# Patient Record
Sex: Male | Born: 1990 | Race: Black or African American | Hispanic: No | Marital: Single | Smoking: Never smoker
Health system: Southern US, Community
[De-identification: ages and names within clinical notes are randomized; demographics above are authoritative.]

## PROBLEM LIST (undated history)

## (undated) DIAGNOSIS — T4145XA Adverse effect of unspecified anesthetic, initial encounter: Secondary | ICD-10-CM

## (undated) DIAGNOSIS — F909 Attention-deficit hyperactivity disorder, unspecified type: Secondary | ICD-10-CM

## (undated) DIAGNOSIS — K219 Gastro-esophageal reflux disease without esophagitis: Secondary | ICD-10-CM

## (undated) DIAGNOSIS — T8859XA Other complications of anesthesia, initial encounter: Secondary | ICD-10-CM

## (undated) DIAGNOSIS — F319 Bipolar disorder, unspecified: Secondary | ICD-10-CM

## (undated) HISTORY — PX: OTHER SURGICAL HISTORY: SHX169

---
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
## 2012-04-13 HISTORY — PX: COLONOSCOPY: SHX5424

## 2012-05-19 ENCOUNTER — Encounter (HOSPITAL_COMMUNITY): Payer: Self-pay | Admitting: *Deleted

## 2012-05-19 ENCOUNTER — Emergency Department (HOSPITAL_COMMUNITY)
Admission: EM | Admit: 2012-05-19 | Discharge: 2012-05-19 | Disposition: A | Discharge status: Home / Self Care | Payer: Medicaid Other | Attending: Emergency Medicine | Admitting: Emergency Medicine

## 2012-05-19 DIAGNOSIS — R229 Localized swelling, mass and lump, unspecified: Secondary | ICD-10-CM | POA: Insufficient documentation

## 2012-05-19 DIAGNOSIS — Z8659 Personal history of other mental and behavioral disorders: Secondary | ICD-10-CM | POA: Insufficient documentation

## 2012-05-19 DIAGNOSIS — L03019 Cellulitis of unspecified finger: Secondary | ICD-10-CM | POA: Insufficient documentation

## 2012-05-19 DIAGNOSIS — L03012 Cellulitis of left finger: Secondary | ICD-10-CM

## 2012-05-19 HISTORY — DX: Bipolar disorder, unspecified: F31.9

## 2012-05-19 HISTORY — DX: Attention-deficit hyperactivity disorder, unspecified type: F90.9

## 2012-05-19 MED ORDER — DOXYCYCLINE HYCLATE 100 MG PO CAPS: 100.0000 mg | ORAL_CAPSULE | Freq: Two times a day (BID) | ORAL | Status: DC

## 2012-05-19 MED ORDER — IBUPROFEN 600 MG PO TABS: 600.0000 mg | ORAL_TABLET | Freq: Four times a day (QID) | ORAL | Status: DC | PRN

## 2012-05-19 MED ORDER — IBUPROFEN 800 MG PO TABS
800.0000 mg | ORAL_TABLET | Freq: Once | ORAL | Status: AC
  Administered 2012-05-19: 800 mg via ORAL
  Filled 2012-05-19: qty 1

## 2012-05-19 MED ORDER — SULFAMETHOXAZOLE-TMP DS 800-160 MG PO TABS
1.0000 | ORAL_TABLET | Freq: Once | ORAL | Status: AC
  Administered 2012-05-19: 1 via ORAL
  Filled 2012-05-19: qty 1

## 2012-05-19 NOTE — ED Notes (Signed)
Pain lt 5th finger , onset today, says pus came out around nail.

## 2012-05-19 NOTE — ED Provider Notes (Signed)
History     CSN: [id number]  Arrival date & time 05/19/12  1756   First MD Initiated Contact with Patient 05/19/12 1827      Chief Complaint  Patient presents with  . Hand Pain    (Consider location/radiation/quality/duration/timing/severity/associated sxs/prior treatment) HPI Comments: Jermaine Williams is a 22 y.o. Male presenting with pain and swelling to the nail edge of his left 5th finger which started today.  He describes squeezing the area and a small amount of pus came out and the swelling is now reduced.  He continues to have pain in the finger tip.  He has taken no medications prior to arrival.  He denies fevers, chills and radiation of pain,  But has noticed a sore nodule at his left elbow today.  He has no other pertinent medical history.     The history is provided by the patient and a relative.    Past Medical History  Diagnosis Date  . Bipolar 1 disorder   . ADHD (attention deficit hyperactivity disorder)     History reviewed. No pertinent past surgical history.  History reviewed. No pertinent family history.  History  Substance Use Topics  . Smoking status: Never Smoker   . Smokeless tobacco: Not on file  . Alcohol Use: No      Review of Systems  Constitutional: Negative for fever and chills.  HENT: Negative for facial swelling.   Respiratory: Negative for shortness of breath and wheezing.   Skin: Positive for wound.  Neurological: Negative for numbness.    Allergies  Review of patient's allergies indicates no known allergies.  Home Medications   Current Outpatient Rx  Name  Route  Sig  Dispense  Refill  . DOXYCYCLINE HYCLATE 100 MG PO CAPS   Oral   Take 1 capsule (100 mg total) by mouth 2 (two) times daily.   20 capsule   0   . IBUPROFEN 600 MG PO TABS   Oral   Take 1 tablet (600 mg total) by mouth every 6 (six) hours as needed for pain.   30 tablet   0     BP 149/97  Pulse 76  Temp 97.9 F (36.6 C) (Oral)  Resp 18  Ht 5\' 11"   (1.803 m)  Wt 147 lb (66.679 kg)  BMI 20.50 kg/m2  SpO2 100%  Physical Exam  Constitutional: He is oriented to person, place, and time. He appears well-developed and well-nourished.  HENT:  Head: Normocephalic.  Cardiovascular: Normal rate.   Pulmonary/Chest: Effort normal.  Musculoskeletal: He exhibits tenderness.       Hands: Lymphadenopathy:       Left: Epitrochlear adenopathy present.  Neurological: He is alert and oriented to person, place, and time. No sensory deficit.  Skin:       Mild erythema and edema at lateral fifth left fifth finger at cuticle edge.  There is no fluctuance and no drainage, no induration at the site.  There is no red streaking and no edema of the finger tuft.      ED Course  Procedures (including critical care time)  Labs Reviewed - No data to display No results found.   1. Paronychia of fifth finger of left hand       MDM  Patient placed on doxycycline and ibuprofen, first dose prior to discharge home.  He was encouraged to use warm Epsom salt soaks several times daily and to keep area clean and dry at other times.  There is no indication  based on today's exam for I&D of the site, there is no fluctuance, induration or evidence of further retained pus pocket.  When necessary followup anticipate, referrals given for obtaining primary care.        Burgess Amor, PA 05/19/12 1936  Burgess Amor, PA 05/19/12 [phone]

## 2012-05-20 NOTE — ED Provider Notes (Signed)
Medical screening examination/treatment/procedure(s) were performed by non-physician practitioner and as supervising physician I was immediately available for consultation/collaboration.   Golden Emile W. Jaysin Gayler, MD 05/20/12 1313 

## 2012-06-20 ENCOUNTER — Emergency Department (HOSPITAL_COMMUNITY)
Admission: EM | Admit: 2012-06-20 | Discharge: 2012-06-20 | Disposition: A | Discharge status: Home / Self Care | Payer: Medicaid Other | Attending: Emergency Medicine | Admitting: Emergency Medicine

## 2012-06-20 ENCOUNTER — Encounter (HOSPITAL_COMMUNITY): Payer: Self-pay | Admitting: *Deleted

## 2012-06-20 ENCOUNTER — Emergency Department (HOSPITAL_COMMUNITY): Payer: Medicaid Other

## 2012-06-20 DIAGNOSIS — R011 Cardiac murmur, unspecified: Secondary | ICD-10-CM | POA: Insufficient documentation

## 2012-06-20 DIAGNOSIS — F909 Attention-deficit hyperactivity disorder, unspecified type: Secondary | ICD-10-CM | POA: Insufficient documentation

## 2012-06-20 DIAGNOSIS — K219 Gastro-esophageal reflux disease without esophagitis: Secondary | ICD-10-CM | POA: Insufficient documentation

## 2012-06-20 DIAGNOSIS — F319 Bipolar disorder, unspecified: Secondary | ICD-10-CM | POA: Insufficient documentation

## 2012-06-20 DIAGNOSIS — Z79899 Other long term (current) drug therapy: Secondary | ICD-10-CM | POA: Insufficient documentation

## 2012-06-20 MED ORDER — FAMOTIDINE 20 MG PO TABS: 20.0000 mg | ORAL_TABLET | Freq: Two times a day (BID) | ORAL | Status: DC | PRN

## 2012-06-20 MED ORDER — GI COCKTAIL ~~LOC~~
30.0000 mL | Freq: Once | ORAL | Status: AC
  Administered 2012-06-20: 30 mL via ORAL
  Filled 2012-06-20: qty 30

## 2012-06-20 NOTE — ED Provider Notes (Signed)
History     CSN: [id number]  Arrival date & time 06/20/12  1500   First MD Initiated Contact with Patient 06/20/12 1624      Chief Complaint  Patient presents with  . Chest Pain    (Consider location/radiation/quality/duration/timing/severity/associated sxs/prior treatment) Patient is a 22 y.o. male presenting with chest pain. The history is provided by the patient.  Chest Pain Pain location:  Substernal area Pain quality: burning   Pain radiates to:  Does not radiate Pain radiates to the back: no   Pain severity:  Moderate Onset quality:  Sudden Timing:  Constant Progression:  Unchanged Chronicity:  New Context: at rest   Relieved by:  None tried Worsened by:  Nothing tried Associated symptoms: no abdominal pain, no back pain, no cough, no diaphoresis, no dizziness, no fever, no headache, no nausea, no numbness, no palpitations, no shortness of breath, not vomiting and no weakness     Past Medical History  Diagnosis Date  . Bipolar 1 disorder   . ADHD (attention deficit hyperactivity disorder)     History reviewed. No pertinent past surgical history.  History reviewed. No pertinent family history.  History  Substance Use Topics  . Smoking status: Never Smoker   . Smokeless tobacco: Not on file  . Alcohol Use: No      Review of Systems  Constitutional: Negative for fever and diaphoresis.  HENT: Negative for congestion, sore throat and neck pain.   Eyes: Negative.   Respiratory: Negative for cough, chest tightness and shortness of breath.   Cardiovascular: Positive for chest pain. Negative for palpitations.  Gastrointestinal: Negative for nausea, vomiting and abdominal pain.  Genitourinary: Negative.   Musculoskeletal: Negative for back pain, joint swelling and arthralgias.  Skin: Negative.  Negative for rash and wound.  Neurological: Negative for dizziness, weakness, light-headedness, numbness and headaches.  Psychiatric/Behavioral: Negative.      Allergies  Review of patient's allergies indicates no known allergies.  Home Medications   Current Outpatient Rx  Name  Route  Sig  Dispense  Refill  . amphetamine-dextroamphetamine (ADDERALL XR) 30 MG 24 hr capsule   Oral   Take 30 mg by mouth every morning.         Marland Kitchen guanFACINE (TENEX) 1 MG tablet   Oral   Take 1 mg by mouth at bedtime.         Marland Kitchen loratadine (CLARITIN) 10 MG tablet   Oral   Take 10 mg by mouth at bedtime.         Marland Kitchen QUEtiapine (SEROQUEL) 100 MG tablet   Oral   Take 100 mg by mouth at bedtime.         . famotidine (PEPCID) 20 MG tablet   Oral   Take 1 tablet (20 mg total) by mouth 2 (two) times daily as needed for heartburn.   30 tablet   0     BP 145/77  Pulse 84  Temp(Src) 97.4 F (36.3 C) (Oral)  Resp 18  Ht 5\' 11"  (1.803 m)  Wt 148 lb 5 oz (67.274 kg)  BMI 20.69 kg/m2  SpO2 100%  Physical Exam  Nursing note and vitals reviewed. Constitutional: He appears well-developed and well-nourished.  HENT:  Head: Normocephalic and atraumatic.  Eyes: Conjunctivae are normal.  Neck: Normal range of motion.  Cardiovascular: Normal rate, regular rhythm and intact distal pulses.   Murmur heard.  Systolic murmur is present with a grade of 2/6  Murmer appreciated at PMI  ,  [facility]  best with patient in forward flexion.  Pulmonary/Chest: Effort normal and breath sounds normal. He has no wheezes.  Abdominal: Soft. Bowel sounds are normal. There is no tenderness.  Musculoskeletal: Normal range of motion.  Neurological: He is alert.  Skin: Skin is warm and dry.  Psychiatric: He has a normal mood and affect.    ED Course  Procedures (including critical care time)   Date: 06/20/2012  Rate: 72  Rhythm: sinus arrhythmia  QRS Axis: normal  Intervals: normal  ST/T Wave abnormalities: normal  Conduction Disutrbances:none  Narrative Interpretation:   Old EKG Reviewed: none available    Labs Reviewed - No data to display Dg Chest 2  View  06/20/2012  *RADIOLOGY REPORT*  Clinical Data: Chest pain  CHEST - 2 VIEW  Comparison: None.  Findings: Cardiac and mediastinal contours appear normal.  The lungs appear clear.  No pleural effusion is identified.  IMPRESSION:  No significant abnormality identified.   Original Report Authenticated By: Gaylyn Rong, M.D.      1. Acid reflux     Pt given gi cocktail with complete resolution of sx.    MDM  Patients labs and/or radiological studies were reviewed during the medical decision making and disposition process. ekg normal.  cxr normal with no evidence of sequalae from heart murmer. Pt states he was told when he was a child he had a heart murmer,  When living in [place] with birth mother.  Denies palpitations,  Sob.  Pt was treated for acid reflux today with pepcid.  He has aged out from seeing Dr Brunilda Payor,  Is planning to establish with Dr  Olena Leatherwood next week.  Encouraged to discuss heart murmer with new pcp.         Burgess Amor, PA-C 06/20/12 1812  Burgess Amor, PA-C 06/20/12 2002

## 2012-06-21 NOTE — ED Provider Notes (Signed)
Medical screening examination/treatment/procedure(s) were performed by non-physician practitioner and as supervising physician I was immediately available for consultation/collaboration.   Charles B. Sheldon, MD 06/21/12 0024 

## 2012-09-06 ENCOUNTER — Encounter (HOSPITAL_COMMUNITY): Payer: Self-pay

## 2012-09-06 ENCOUNTER — Emergency Department (HOSPITAL_COMMUNITY)
Admission: EM | Admit: 2012-09-06 | Discharge: 2012-09-06 | Disposition: A | Discharge status: Home / Self Care | Payer: Medicaid Other | Attending: Emergency Medicine | Admitting: Emergency Medicine

## 2012-09-06 DIAGNOSIS — F319 Bipolar disorder, unspecified: Secondary | ICD-10-CM | POA: Insufficient documentation

## 2012-09-06 DIAGNOSIS — F909 Attention-deficit hyperactivity disorder, unspecified type: Secondary | ICD-10-CM | POA: Insufficient documentation

## 2012-09-06 DIAGNOSIS — R1013 Epigastric pain: Secondary | ICD-10-CM | POA: Insufficient documentation

## 2012-09-06 DIAGNOSIS — K625 Hemorrhage of anus and rectum: Secondary | ICD-10-CM | POA: Insufficient documentation

## 2012-09-06 DIAGNOSIS — Z79899 Other long term (current) drug therapy: Secondary | ICD-10-CM | POA: Insufficient documentation

## 2012-09-06 DIAGNOSIS — K921 Melena: Secondary | ICD-10-CM | POA: Insufficient documentation

## 2012-09-06 LAB — CBC WITH DIFFERENTIAL/PLATELET
Basophils Absolute: 0 10*3/uL (ref 0.0–0.1)
Lymphocytes Relative: 29 % (ref 12–46)
Lymphs Abs: 1.2 10*3/uL (ref 0.7–4.0)
Neutro Abs: 2.3 10*3/uL (ref 1.7–7.7)
Neutrophils Relative %: 56 % (ref 43–77)
Platelets: 171 10*3/uL (ref 150–400)
RBC: 4.61 MIL/uL (ref 4.22–5.81)
WBC: 4.2 10*3/uL (ref 4.0–10.5)

## 2012-09-06 LAB — BASIC METABOLIC PANEL
CO2: 30 mEq/L (ref 19–32)
Glucose, Bld: 92 mg/dL (ref 70–99)
Potassium: 4.5 mEq/L (ref 3.5–5.1)
Sodium: 141 mEq/L (ref 135–145)

## 2012-09-06 LAB — OCCULT BLOOD, POC DEVICE: Fecal Occult Bld: NEGATIVE

## 2012-09-06 NOTE — ED Notes (Signed)
Pt says noticed bright red blood in stool this am.  Denies any pain.

## 2012-09-06 NOTE — ED Provider Notes (Signed)
History     CSN: [id number]  Arrival date & time 09/06/12  1143   First MD Initiated Contact with Patient 09/06/12 1302      Chief Complaint  Patient presents with  . Rectal Bleeding    (Consider location/radiation/quality/duration/timing/severity/associated sxs/prior treatment) Patient is a 22 y.o. male presenting with hematochezia. The history is provided by the patient.  Rectal Bleeding Associated symptoms: abdominal pain   Associated symptoms: no fever and no vomiting    patient with a single bowel movement today with some blood streaking. Cold water did not turn all red. Stool was not black in color. Not associated with any rectal pain. Patient has had a history of some abdominal discomfort mostly epigastric for the past 2 months. Has been on Pepcid without any significant change. Patient currently does not have a primary care Dr. The epigastric pain and abdominal pain is described somewhat as a burning sensation. No pain currently.  Past Medical History  Diagnosis Date  . Bipolar 1 disorder   . ADHD (attention deficit hyperactivity disorder)     History reviewed. No pertinent past surgical history.  No family history on file.  History  Substance Use Topics  . Smoking status: Never Smoker   . Smokeless tobacco: Not on file  . Alcohol Use: No      Review of Systems  Constitutional: Negative for fever.  HENT: Negative for congestion and neck pain.   Eyes: Negative for redness.  Respiratory: Negative for shortness of breath.   Cardiovascular: Negative for chest pain.  Gastrointestinal: Positive for abdominal pain, blood in stool and hematochezia. Negative for nausea, vomiting, diarrhea and rectal pain.  Genitourinary: Negative for dysuria.  Musculoskeletal: Negative for back pain.  Skin: Negative for rash.  Neurological: Negative for headaches.  Hematological: Does not bruise/bleed easily.  Psychiatric/Behavioral: Negative for confusion.    Allergies  Review  of patient's allergies indicates no known allergies.  Home Medications   Current Outpatient Rx  Name  Route  Sig  Dispense  Refill  . acetaminophen (TYLENOL) 325 MG tablet   Oral   Take 650 mg by mouth every 6 (six) hours as needed for pain.         Marland Kitchen amphetamine-dextroamphetamine (ADDERALL XR) 30 MG 24 hr capsule   Oral   Take 30 mg by mouth every morning.         . famotidine (PEPCID) 20 MG tablet   Oral   Take 1 tablet (20 mg total) by mouth 2 (two) times daily as needed for heartburn.   30 tablet   0   . guanFACINE (TENEX) 1 MG tablet   Oral   Take 1 mg by mouth at bedtime.         Marland Kitchen loratadine (CLARITIN) 10 MG tablet   Oral   Take 10 mg by mouth at bedtime.         Marland Kitchen QUEtiapine (SEROQUEL) 100 MG tablet   Oral   Take 100 mg by mouth at bedtime.           BP 136/85  Pulse 74  Temp(Src) 98.5 F (36.9 C) (Oral)  Resp 20  Ht 5\' 11"  (1.803 m)  Wt 145 lb (65.772 kg)  BMI 20.23 kg/m2  SpO2 100%  Physical Exam  Nursing note and vitals reviewed. Constitutional: He is oriented to person, place, and time. He appears well-developed and well-nourished. No distress.  HENT:  Head: Normocephalic and atraumatic.  Mouth/Throat: Oropharynx is clear and moist.  Eyes:  Conjunctivae and EOM are normal. Pupils are equal, round, and reactive to light.  Neck: Normal range of motion. Neck supple.  Cardiovascular: Normal rate, regular rhythm and normal heart sounds.   No murmur heard. Pulmonary/Chest: Effort normal.  Abdominal: Soft. Bowel sounds are normal. There is no tenderness.  Genitourinary: Rectum normal.  Musculoskeletal: Normal range of motion. He exhibits no edema.  Neurological: He is alert and oriented to person, place, and time. No cranial nerve deficit. He exhibits normal muscle tone. Coordination normal.  Skin: Skin is warm. No rash noted.    ED Course  Procedures (including critical care time)  Labs Reviewed  CBC WITH DIFFERENTIAL  BASIC METABOLIC  PANEL  OCCULT BLOOD, POC DEVICE   No results found. Results for orders placed during the hospital encounter of 09/06/12  CBC WITH DIFFERENTIAL      Result Value Range   WBC 4.2  4.0 - 10.5 K/uL   RBC 4.61  4.22 - 5.81 MIL/uL   Hemoglobin 13.9  13.0 - 17.0 g/dL   HCT 47.8  29.5 - 62.1 %   MCV 91.8  78.0 - 100.0 fL   MCH 30.2  26.0 - 34.0 pg   MCHC 32.9  30.0 - 36.0 g/dL   RDW 30.8  65.7 - 84.6 %   Platelets 171  150 - 400 K/uL   Neutrophils Relative % 56  43 - 77 %   Neutro Abs 2.3  1.7 - 7.7 K/uL   Lymphocytes Relative 29  12 - 46 %   Lymphs Abs 1.2  0.7 - 4.0 K/uL   Monocytes Relative 12  3 - 12 %   Monocytes Absolute 0.5  0.1 - 1.0 K/uL   Eosinophils Relative 4  0 - 5 %   Eosinophils Absolute 0.2  0.0 - 0.7 K/uL   Basophils Relative 0  0 - 1 %   Basophils Absolute 0.0  0.0 - 0.1 K/uL  BASIC METABOLIC PANEL      Result Value Range   Sodium 141  135 - 145 mEq/L   Potassium 4.5  3.5 - 5.1 mEq/L   Chloride 104  96 - 112 mEq/L   CO2 30  19 - 32 mEq/L   Glucose, Bld 92  70 - 99 mg/dL   BUN 12  6 - 23 mg/dL   Creatinine, Ser 9.62  0.50 - 1.35 mg/dL   Calcium 9.6  8.4 - 95.2 mg/dL   GFR calc non Af Amer >90  >90 mL/min   GFR calc Af Amer >90  >90 mL/min  OCCULT BLOOD, POC DEVICE      Result Value Range   Fecal Occult Bld NEGATIVE  NEGATIVE     1. Rectal bleeding       MDM  Patient by history and by photograph you brought in had some streaking of blood in the bowel movement today. Hemoglobin and hematocrit is negative. Rectal examination was normal and also Hemoccult negative. Patient is also had some epigastric discomfort for a couple months referral to GI would be appropriate for further evaluation. Both on vital signs in by exam and labs no significant GI bleed at this point in time. Patient is stable for discharge home.       Shelda Jakes, MD 09/06/12 [phone]

## 2012-10-04 ENCOUNTER — Encounter: Payer: Self-pay | Admitting: Gastroenterology

## 2012-10-04 ENCOUNTER — Ambulatory Visit (INDEPENDENT_AMBULATORY_CARE_PROVIDER_SITE_OTHER): Payer: Medicaid Other | Admitting: Gastroenterology

## 2012-10-04 VITALS — BP 123/69 | HR 56 | Temp 97.8°F | Ht 71.0 in | Wt 158.0 lb

## 2012-10-04 DIAGNOSIS — K625 Hemorrhage of anus and rectum: Secondary | ICD-10-CM

## 2012-10-04 DIAGNOSIS — R1032 Left lower quadrant pain: Secondary | ICD-10-CM | POA: Insufficient documentation

## 2012-10-04 MED ORDER — PEG 3350-KCL-NA BICARB-NACL 420 G PO SOLR: 4000.0000 mL | ORAL | Status: DC

## 2012-10-04 NOTE — Progress Notes (Signed)
Primary Care Physician:  Antonietta Barcelona, MD  Primary Gastroenterologist:  Jonette Eva, MD   Chief Complaint  Patient presents with  . Abdominal Pain  . Rectal Bleeding    HPI:  Jermaine Williams is a 22 y.o. male here for further evaluation of rectal bleeding. Recently seen in the emergency department for the same. Rectal exam benign. Stool heme negative at that time. Patient presents today with his adoptive mother. He reports 2 bowel movements daily. BRBPR/pink on the stool. No rectal pain. , Stools can be rather large. He denies straining. He has had several episodes of rectal bleeding. No melena. Few months of lower abdominal pain, left greater than right. Burning pain. No heartburn/indigestion since started Pepcid by his PCP several months ago. No vomiting. No weight loss. Appetite good. No ASA/NSAIDs. No recent medication changes.  Current Outpatient Prescriptions  Medication Sig Dispense Refill  . acetaminophen (TYLENOL) 325 MG tablet Take 650 mg by mouth every 6 (six) hours as needed for pain.      Marland Kitchen amphetamine-dextroamphetamine (ADDERALL XR) 30 MG 24 hr capsule Take 30 mg by mouth every morning.      . famotidine (PEPCID) 20 MG tablet Take 1 tablet (20 mg total) by mouth 2 (two) times daily as needed for heartburn.  30 tablet  0  . guanFACINE (TENEX) 1 MG tablet Take 1 mg by mouth at bedtime.      Marland Kitchen loratadine (CLARITIN) 10 MG tablet Take 10 mg by mouth at bedtime.      Marland Kitchen QUEtiapine (SEROQUEL) 100 MG tablet Take 100 mg by mouth at bedtime.       No current facility-administered medications for this visit.    Allergies as of 10/04/2012  . (No Known Allergies)    Past Medical History  Diagnosis Date  . Bipolar 1 disorder   . ADHD (attention deficit hyperactivity disorder)     Past Surgical History  Procedure Laterality Date  . None      Family History  Problem Relation Age of Onset  . Adopted: Yes    History   Social History  . Marital Status: Single    Spouse Name:  N/A    Number of Children: N/A  . Years of Education: N/A   Occupational History  . [employer] student    Social History Main Topics  . Smoking status: Never Smoker   . Smokeless tobacco: Not on file  . Alcohol Use: No  . Drug Use: No  . Sexually Active:    Other Topics Concern  . Not on file   Social History Narrative  . No narrative on file      ROS:  General: Negative for anorexia, weight loss, fever, chills, fatigue, weakness. Eyes: Negative for vision changes.  ENT: Negative for hoarseness, difficulty swallowing , nasal congestion. CV: Negative for chest pain, angina, palpitations, dyspnea on exertion, peripheral edema.  Respiratory: Negative for dyspnea at rest, dyspnea on exertion, cough, sputum, wheezing.  GI: See history of present illness. GU:  Negative for dysuria, hematuria, urinary incontinence, urinary frequency, nocturnal urination.  MS: Negative for joint pain, low back pain.  Derm: Negative for rash or itching.  Neuro: Negative for weakness, abnormal sensation, seizure, frequent headaches, memory loss, confusion.  Psych: Negative for anxiety, depression, suicidal ideation, hallucinations.  Endo: Negative for unusual weight change.  Heme: Negative for bruising or bleeding. Allergy: Negative for rash or hives.    Physical Examination:  BP 123/69  Pulse 56  Temp(Src) 97.8 F (36.6  C) (Oral)  Ht 5\' 11"  (1.803 m)  Wt 158 lb (71.668 kg)  BMI 22.05 kg/m2   General: Well-nourished, well-developed in no acute distress. Accompanied by mother. Head: Normocephalic, atraumatic.   Eyes: Conjunctiva pink, no icterus. Mouth: Oropharyngeal mucosa moist and pink , no lesions erythema or exudate. Neck: Supple without thyromegaly, masses, or lymphadenopathy.  Lungs: Clear to auscultation bilaterally.  Heart: Regular rate and rhythm, no murmurs rubs or gallops.  Abdomen: Bowel sounds are normal, nontender, nondistended, no hepatosplenomegaly or masses, no abdominal  bruits or    hernia , no rebound or guarding.   Rectal: Deferred Extremities: No lower extremity edema. No clubbing or deformities.  Neuro: Alert and oriented x 4 , grossly normal neurologically.  Skin: Warm and dry, no rash or jaundice.   Psych: Alert and cooperative, normal mood and affect.  Labs: Lab Results  Component Value Date   WBC 4.2 09/06/2012   HGB 13.9 09/06/2012   HCT 42.3 09/06/2012   MCV 91.8 09/06/2012   PLT 171 09/06/2012   Lab Results  Component Value Date   CREATININE 0.89 09/06/2012   BUN 12 09/06/2012   NA 141 09/06/2012   K 4.5 09/06/2012   CL 104 09/06/2012   CO2 30 09/06/2012   Heme Negative 09/06/12.  Imaging Studies: No results found.

## 2012-10-04 NOTE — Patient Instructions (Addendum)
1. Call if your bleeding worsens. 2. Colonoscopy as scheduled. See separate instructions.

## 2012-10-04 NOTE — Assessment & Plan Note (Signed)
22 year old gentleman who presents with several episodes of small-volume hematochezia, several month history of left lower abdominal pain. Denies constipation or straining. Denies rectal pain. Ddx includes anorectal fissure, hemorrhoids, malignancy, less likely IBD. Adoptive mother is quite concerned. Agree with diagnostic colonoscopy but suspect benign anorectal source.  I have discussed the risks, alternatives, benefits with regards to but not limited to the risk of reaction to medication, bleeding, infection, perforation and the patient is agreeable to proceed. Written consent to be obtained.

## 2012-10-04 NOTE — Progress Notes (Signed)
CC PCP 

## 2012-10-05 ENCOUNTER — Encounter (HOSPITAL_COMMUNITY): Payer: Self-pay | Admitting: Pharmacy Technician

## 2012-10-06 ENCOUNTER — Encounter (HOSPITAL_COMMUNITY)
Admission: RE | Disposition: A | Discharge status: Home / Self Care | Payer: Self-pay | Source: Ambulatory Visit | Attending: Gastroenterology

## 2012-10-06 ENCOUNTER — Ambulatory Visit (HOSPITAL_COMMUNITY)
Admission: RE | Admit: 2012-10-06 | Discharge: 2012-10-06 | Disposition: A | Discharge status: Home / Self Care | Payer: Medicaid Other | Source: Ambulatory Visit | Attending: Gastroenterology | Admitting: Gastroenterology

## 2012-10-06 ENCOUNTER — Encounter (HOSPITAL_COMMUNITY): Payer: Self-pay

## 2012-10-06 DIAGNOSIS — F319 Bipolar disorder, unspecified: Secondary | ICD-10-CM | POA: Insufficient documentation

## 2012-10-06 DIAGNOSIS — Z79899 Other long term (current) drug therapy: Secondary | ICD-10-CM | POA: Insufficient documentation

## 2012-10-06 DIAGNOSIS — K625 Hemorrhage of anus and rectum: Secondary | ICD-10-CM

## 2012-10-06 DIAGNOSIS — K648 Other hemorrhoids: Secondary | ICD-10-CM

## 2012-10-06 DIAGNOSIS — R1032 Left lower quadrant pain: Secondary | ICD-10-CM

## 2012-10-06 DIAGNOSIS — F909 Attention-deficit hyperactivity disorder, unspecified type: Secondary | ICD-10-CM | POA: Insufficient documentation

## 2012-10-06 HISTORY — DX: Gastro-esophageal reflux disease without esophagitis: K21.9

## 2012-10-06 HISTORY — PX: COLONOSCOPY: SHX5424

## 2012-10-06 HISTORY — DX: Adverse effect of unspecified anesthetic, initial encounter: T41.45XA

## 2012-10-06 HISTORY — DX: Other complications of anesthesia, initial encounter: T88.59XA

## 2012-10-06 SURGERY — COLONOSCOPY
Anesthesia: Moderate Sedation

## 2012-10-06 MED ORDER — OMEPRAZOLE 20 MG PO CPDR: DELAYED_RELEASE_CAPSULE | ORAL | Status: AC

## 2012-10-06 MED ORDER — MEPERIDINE HCL 100 MG/ML IJ SOLN
INTRAMUSCULAR | Status: AC
  Filled 2012-10-06: qty 2

## 2012-10-06 MED ORDER — SODIUM CHLORIDE 0.9 % IJ SOLN
INTRAMUSCULAR | Status: AC
  Filled 2012-10-06: qty 10

## 2012-10-06 MED ORDER — MEPERIDINE HCL 100 MG/ML IJ SOLN
INTRAMUSCULAR | Status: DC | PRN
  Administered 2012-10-06 (×4): 25 mg via INTRAVENOUS

## 2012-10-06 MED ORDER — MIDAZOLAM HCL 5 MG/5ML IJ SOLN
INTRAMUSCULAR | Status: AC
  Filled 2012-10-06: qty 10

## 2012-10-06 MED ORDER — PROMETHAZINE HCL 25 MG/ML IJ SOLN
INTRAMUSCULAR | Status: AC
  Filled 2012-10-06: qty 1

## 2012-10-06 MED ORDER — PROMETHAZINE HCL 25 MG/ML IJ SOLN
12.5000 mg | Freq: Once | INTRAMUSCULAR | Status: AC
  Administered 2012-10-06: 12.5 mg via INTRAVENOUS

## 2012-10-06 MED ORDER — SODIUM CHLORIDE 0.9 % IV SOLN
INTRAVENOUS | Status: DC
Start: 2012-10-06 — End: 2012-10-06
  Administered 2012-10-06: 11:00:00 via INTRAVENOUS

## 2012-10-06 MED ORDER — STERILE WATER FOR IRRIGATION IR SOLN
Status: DC | PRN
  Administered 2012-10-06: 13:00:00

## 2012-10-06 MED ORDER — MIDAZOLAM HCL 5 MG/5ML IJ SOLN
INTRAMUSCULAR | Status: DC | PRN
  Administered 2012-10-06 (×4): 2 mg via INTRAVENOUS

## 2012-10-06 NOTE — H&P (Signed)
  Primary Care Physician:  Antonietta Barcelona, MD Primary Gastroenterologist:  Dr. Darrick Penna  Pre-Procedure History & Physical: HPI:  Jermaine Williams is a 22 y.o. male here for  BRBPR.   Past Medical History  Diagnosis Date  . Bipolar 1 disorder   . ADHD (attention deficit hyperactivity disorder)   . Complication of anesthesia     pt states he threw up with laughing gas    Past Surgical History  Procedure Laterality Date  . None    . Cavities      lots filled    Prior to Admission medications   Medication Sig Start Date End Date Taking? Authorizing Provider  acetaminophen (TYLENOL) 325 MG tablet Take 650 mg by mouth every 6 (six) hours as needed for pain.   Yes Historical Provider, MD  amphetamine-dextroamphetamine (ADDERALL XR) 30 MG 24 hr capsule Take 30 mg by mouth every morning.   Yes Historical Provider, MD  guanFACINE (TENEX) 1 MG tablet Take 1 mg by mouth at bedtime.   Yes Historical Provider, MD  loratadine (CLARITIN) 10 MG tablet Take 10 mg by mouth at bedtime.   Yes Historical Provider, MD  polyethylene glycol-electrolytes (TRILYTE) 420 G solution Take 4,000 mLs by mouth as directed. 10/04/12  Yes [place], MD  QUEtiapine (SEROQUEL) 100 MG tablet Take 100 mg by mouth at bedtime.   Yes Historical Provider, MD    Allergies as of 10/04/2012  . (No Known Allergies)    Family History  Problem Relation Age of Onset  . Adopted: Yes    History   Social History  . Marital Status: Single    Spouse Name: N/A    Number of Children: N/A  . Years of Education: N/A   Occupational History  . [employer] student    Social History Main Topics  . Smoking status: Never Smoker   . Smokeless tobacco: Not on file  . Alcohol Use: No  . Drug Use: No  . Sexually Active: Not on file   Other Topics Concern  . Not on file   Social History Narrative  . No narrative on file    Review of Systems: See HPI, otherwise negative ROS   Physical Exam: BP 126/72  Pulse 68  Temp(Src) 98.1  F (36.7 C) (Oral)  Resp 21  SpO2 99% General:   Alert,  pleasant and cooperative in NAD Head:  Normocephalic and atraumatic. Neck:  Supple; Lungs:  Clear throughout to auscultation.    Heart:  Regular rate and rhythm. Abdomen:  Soft, nontender and nondistended. Normal bowel sounds, without guarding, and without rebound.   Neurologic:  Alert and  oriented x4;  grossly normal neurologically.  Impression/Plan:    BRBPR  PLAN: TCS TODAY

## 2012-10-06 NOTE — Op Note (Signed)
[facility] [address] [place], [address]   COLONOSCOPY PROCEDURE REPORT  PATIENT: Jermaine Williams, Jermaine Williams  MR#: [id number] BIRTHDATE: 07/17/1990 , 22  yrs. old GENDER: Male ENDOSCOPIST: Jonette Eva, MD REFERRED BY:   NONE PROCEDURE DATE:  10/06/2012 PROCEDURE:   Colonoscopy, diagnostic INDICATIONS:Rectal Bleeding.  > 1-2X/MO, < 1-2X/WK MEDICATIONS: Demerol 100 mg IV, Versed 8 mg IV, PREOP-Promethazine (Phenergan) 12.5mg  IV  DESCRIPTION OF PROCEDURE:    Physical exam was performed.  Informed consent was obtained from the patient after explaining the benefits, risks, and alternatives to procedure.  The patient was connected to monitor and placed in left lateral position. Continuous oxygen was provided by nasal cannula and IV medicine administered through an indwelling cannula.  After administration of sedation and rectal exam, the patients rectum was intubated and the EC-[id number] ([id number])  colonoscope was advanced under direct visualization to the ileum.  The scope was removed slowly by carefully examining the color, texture, anatomy, and integrity mucosa on the way out.  The patient was recovered in endoscopy and discharged home in satisfactory condition.    COLON FINDINGS: The mucosa appeared normal in the terminal ileum.  , A normal appearing cecum, ileocecal valve, and appendiceal orifice were identified.  The ascending, hepatic flexure, transverse, splenic flexure, descending, sigmoid colon and rectum appeared unremarkable.  No polyps or cancers were seen.  , and Small internal hemorrhoids were found.  PREP QUALITY: good.   CECAL W/D TIME: 13 minutes     COMPLICATIONS: None  ENDOSCOPIC IMPRESSION: 1.   Normal mucosa in the terminal ileum 2.   Normal colon 3.   RECTAL BLEEDING DUE TO Small internal hemorrhoids  RECOMMENDATIONS: DRINK WATER TO KEEP YOUR URINE LIGHT YELLOW. FOLLOW A HIGH FIBER DIET.  AVOID ITEMS THAT CAUSE BLOATING. USE PREParation H 2  TO 4 TIMES A DAY AS NEEDED FOR RECTAL PRESSURE/PAIN/BLEEDING.  CALL THE [facility] IF SYMPTOMS NOT IMRPOVED AFTER 7 DAYS.  Next colonoscopy in AGE 16.       _______________________________ eSignedJonette Eva, MD 10/06/2012 1:40 PM

## 2012-10-07 ENCOUNTER — Encounter (HOSPITAL_COMMUNITY): Payer: Self-pay | Admitting: Gastroenterology

## 2012-12-17 ENCOUNTER — Emergency Department (HOSPITAL_COMMUNITY)
Admission: EM | Admit: 2012-12-17 | Discharge: 2012-12-17 | Disposition: A | Discharge status: Home / Self Care | Payer: Medicaid Other | Attending: Emergency Medicine | Admitting: Emergency Medicine

## 2012-12-17 ENCOUNTER — Other Ambulatory Visit: Payer: Self-pay

## 2012-12-17 ENCOUNTER — Encounter (HOSPITAL_COMMUNITY): Payer: Self-pay | Admitting: *Deleted

## 2012-12-17 DIAGNOSIS — K219 Gastro-esophageal reflux disease without esophagitis: Secondary | ICD-10-CM | POA: Insufficient documentation

## 2012-12-17 DIAGNOSIS — F319 Bipolar disorder, unspecified: Secondary | ICD-10-CM | POA: Insufficient documentation

## 2012-12-17 DIAGNOSIS — R0789 Other chest pain: Secondary | ICD-10-CM | POA: Insufficient documentation

## 2012-12-17 DIAGNOSIS — R0602 Shortness of breath: Secondary | ICD-10-CM | POA: Insufficient documentation

## 2012-12-17 DIAGNOSIS — Z79899 Other long term (current) drug therapy: Secondary | ICD-10-CM | POA: Insufficient documentation

## 2012-12-17 DIAGNOSIS — R079 Chest pain, unspecified: Secondary | ICD-10-CM

## 2012-12-17 DIAGNOSIS — F909 Attention-deficit hyperactivity disorder, unspecified type: Secondary | ICD-10-CM | POA: Insufficient documentation

## 2012-12-17 NOTE — ED Notes (Signed)
Patient reports: -he woke at 09 and was SOB with CP -pain was rated 8/10 -a history of asthma but can not remember having an asthma attack -the pain subsided after receiving medication en route   -pain is currently a 0/10 -and breathing issue has resolved

## 2012-12-17 NOTE — ED Provider Notes (Signed)
CSN: [id number]     Arrival date & time 12/17/12  1016 History  This chart was scribed for Donnetta Hutching, MD by Quintella Reichert, ED scribe.  This patient was seen in room [facility]08[facility]08 and the patient's care was started at 10:54 AM.    Chief Complaint  Patient presents with  . Chest Pain    The history is provided by the patient. No language interpreter was used.    HPI Comments: Jermaine Williams is a 22 y.o. male with h/o GERD, ADHD, and bipolar disorder who presents to the Emergency Department complaining of an episode of moderate bilateral chest pain with associated SOB that began 2 hours ago on waking.  Pt does not know how long the episode lasted but he states at present it has resolved.  He denies prior h/o similar symptoms.  He denies any unusual activities that may have brought on pain.     Past Medical History  Diagnosis Date  . Bipolar 1 disorder   . ADHD (attention deficit hyperactivity disorder)   . Complication of anesthesia     pt states he threw up with laughing gas  . GERD (gastroesophageal reflux disease)     Past Surgical History  Procedure Laterality Date  . None    . Cavities      lots filled  . Colonoscopy  2014    [facility]  . Colonoscopy N/A 10/06/2012    Procedure: COLONOSCOPY;  Surgeon: West Bali, MD;  Location: [facility];  Service: Endoscopy;  Laterality: N/A;  12:45    Family History  Problem Relation Age of Onset  . Adopted: Yes    History  Substance Use Topics  . Smoking status: Never Smoker   . Smokeless tobacco: Not on file  . Alcohol Use: No     Review of Systems A complete 10 system review of systems was obtained and all systems are negative except as noted in the HPI and PMH.    Allergies  Peanut-containing drug products  Home Medications   Current Outpatient Rx  Name  Route  Sig  Dispense  Refill  . acetaminophen (TYLENOL) 325 MG tablet   Oral   Take 650 mg by mouth every 6 (six) hours as needed for pain.         Marland Kitchen  amphetamine-dextroamphetamine (ADDERALL XR) 30 MG 24 hr capsule   Oral   Take 30 mg by mouth every morning.         Marland Kitchen guanFACINE (TENEX) 1 MG tablet   Oral   Take 1 mg by mouth at bedtime.         Marland Kitchen loratadine (CLARITIN) 10 MG tablet   Oral   Take 10 mg by mouth at bedtime.         Marland Kitchen omeprazole (PRILOSEC) 20 MG capsule      1 po every morning 30 minutes prior to your first meal.   30 capsule   11   . QUEtiapine (SEROQUEL) 100 MG tablet   Oral   Take 100 mg by mouth at bedtime.          BP 143/71  Pulse 67  Temp(Src) 98 F (36.7 C) (Oral)  Resp 16  Ht 5\' 11"  (1.803 m)  Wt 158 lb (71.668 kg)  BMI 22.05 kg/m2  SpO2 100%  Physical Exam  Nursing note and vitals reviewed. Constitutional: He is oriented to person, place, and time. He appears well-developed and well-nourished.  HENT:  Head: Normocephalic and atraumatic.  Eyes: Conjunctivae and EOM are normal. Pupils are equal, round, and reactive to light.  Neck: Normal range of motion. Neck supple.  Cardiovascular: Normal rate, regular rhythm and normal heart sounds.   Pulmonary/Chest: Effort normal and breath sounds normal.  Abdominal: Soft. Bowel sounds are normal.  Musculoskeletal: Normal range of motion.  Neurological: He is alert and oriented to person, place, and time.  Skin: Skin is warm and dry.  Psychiatric: He has a normal mood and affect.    ED Course  Procedures (including critical care time)  DIAGNOSTIC STUDIES: Oxygen Saturation is 100% on room air, normal by my interpretation.    COORDINATION OF CARE: 10:59 AM-Informed pt that symptoms are very unlikely to be heart-related and an extensive cardiac workup is not indicated.  Discussed treatment plan which includes EKG with pt at bedside and pt agreed to plan.     Date: 12/17/2012  Rate: 79  Rhythm: normal sinus rhythm  QRS Axis: normal  Intervals: normal  ST/T Wave abnormalities: normal  Conduction Disutrbances: none  Narrative  Interpretation: unremarkable    MDM  No diagnosis found. Patient is extremely low risk for ACS or PE.    EKG normal    I personally performed the services described in this documentation, which was scribed in my presence. The recorded information has been reviewed and is accurate.    Donnetta Hutching, MD 12/17/12 1135

## 2012-12-17 NOTE — ED Notes (Signed)
Patient: -DC'd in no apparent distress with RR and effort WDL -ambulatory out of the ED with stable gait noted and companion at side -reported understanding DC instructions -stable vitals noted at DC  

## 2012-12-17 NOTE — ED Notes (Signed)
Pt woke up with upper mid sternal chest pain, EMS gave 1 nitro en route, pt states pain free at the moment, EMS also gave 324mg  ASA.

## 2013-03-30 ENCOUNTER — Encounter (INDEPENDENT_AMBULATORY_CARE_PROVIDER_SITE_OTHER): Payer: Self-pay

## 2013-03-30 ENCOUNTER — Encounter: Payer: Self-pay | Admitting: Gastroenterology

## 2013-03-30 ENCOUNTER — Telehealth: Payer: Self-pay | Admitting: Gastroenterology

## 2013-03-30 ENCOUNTER — Ambulatory Visit (INDEPENDENT_AMBULATORY_CARE_PROVIDER_SITE_OTHER): Payer: Medicaid Other | Admitting: Gastroenterology

## 2013-03-30 VITALS — BP 125/76 | HR 67 | Temp 98.3°F | Wt 164.0 lb

## 2013-03-30 DIAGNOSIS — K649 Unspecified hemorrhoids: Secondary | ICD-10-CM

## 2013-03-30 DIAGNOSIS — K219 Gastro-esophageal reflux disease without esophagitis: Secondary | ICD-10-CM

## 2013-03-30 NOTE — Assessment & Plan Note (Signed)
Doing well on omeprazole. Can try to back off to every other day to see if heartburn comes back. If symptoms recurrent then take daily. Office visit with Dr. Darrick Penna in 2 years.

## 2013-03-30 NOTE — Progress Notes (Signed)
cc'd to pcp 

## 2013-03-30 NOTE — Progress Notes (Signed)
      Primary Care Physician: Milana Obey, MD  Primary Gastroenterologist:  Jonette Eva, MD   Chief Complaint  Patient presents with  . Follow-up    HPI: Jermaine Williams is a 22 y.o. male here for followup visit. Seen back in June with complaints of left lower quadrant pain and rectal bleeding. Colonoscopy revealed internal hemorrhoids but otherwise unremarkable ileocolonoscopy. Patient has been doing well. Denies further bleeding. Rarely has any problems with his hemorrhoids. Bowel movements are very regular. Does not have to strain. Denies any heartburn. Switch from Pepcid to omeprazole after his colonoscopy. Denies any dysphagia or odynophagia. 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  Current Outpatient Prescriptions  Medication Sig Dispense Refill  . acetaminophen (TYLENOL) 325 MG tablet Take 650 mg by mouth every 6 (six) hours as needed for pain.      Marland Kitchen amphetamine-dextroamphetamine (ADDERALL XR) 30 MG 24 hr capsule Take 30 mg by mouth every morning.      Marland Kitchen guanFACINE (TENEX) 1 MG tablet Take 1 mg by mouth at bedtime.      Marland Kitchen loratadine (CLARITIN) 10 MG tablet Take 10 mg by mouth at bedtime.      Marland Kitchen omeprazole (PRILOSEC) 20 MG capsule 1 po every morning 30 minutes prior to your first meal.  30 capsule  11  . QUEtiapine (SEROQUEL) 100 MG tablet Take 100 mg by mouth at bedtime.       No current facility-administered medications for this visit.    Allergies as of 03/30/2013 - Review Complete 03/30/2013  Allergen Reaction Noted  . Peanut-containing drug products Other (See Comments) 10/05/2012    ROS:  General: Negative for anorexia, weight loss, fever, chills, fatigue, weakness. ENT: Negative for hoarseness, difficulty swallowing , nasal congestion. CV: Negative for chest pain, angina, palpitations, dyspnea on exertion, peripheral edema.  Respiratory: Negative for dyspnea at rest, dyspnea on exertion, cough, sputum, wheezing.  GI: See history of  present illness. GU:  Negative for dysuria, hematuria, urinary incontinence, urinary frequency, nocturnal urination.  Endo: Negative for unusual weight change.    Physical Examination:   BP 125/76  Pulse 67  Temp(Src) 98.3 F (36.8 C) (Oral)  Wt 164 lb (74.39 kg)  General: Well-nourished, well-developed in no acute distress.  Eyes: No icterus. Mouth: Oropharyngeal mucosa moist and pink , no lesions erythema or exudate. Lungs: Clear to auscultation bilaterally.  Heart: Regular rate and rhythm, no murmurs rubs or gallops.  Abdomen: Bowel sounds are normal, nontender, nondistended, no hepatosplenomegaly or masses, no abdominal bruits or hernia , no rebound or guarding.   Extremities: No lower extremity edema. No clubbing or deformities. Neuro: Alert and oriented x 4   Skin: Warm and dry, no jaundice.   Psych: Alert and cooperative, normal mood and affect.

## 2013-03-30 NOTE — Patient Instructions (Signed)
1. Please call is you have any further problems with your hemorrhoids or recurrent abdominal pain.

## 2013-03-30 NOTE — Assessment & Plan Note (Signed)
Doing well. Discussed if hemorrhoids become more problematic in the future and unresponsive to topical therapy, consider banding.

## 2013-03-30 NOTE — Telephone Encounter (Signed)
Left message at home for patient's mother to call regarding the omeprazole. I did not realize he was on this when he was seen earlier today, previously had been on Pepcid. He had stated that his heartburn is well controlled. I would recommend that he try a decreasing dose to every other day and his his heartburn remains well-controlled continue at that dose for now. Otherwise if he has breakthrough symptoms he can take every day.  He will need a followup office visit with Dr. Darrick Penna in 2 years for his reflux.

## 2013-04-03 NOTE — Telephone Encounter (Signed)
Called and informed pt's mom.  

## 2013-04-14 NOTE — Progress Notes (Signed)
REVIEWED.  

## 2013-04-14 NOTE — Progress Notes (Signed)
REVIEWED.  JUN 2014 [facility]

## 2013-10-04 IMAGING — CR DG CHEST 2V
2 series · 2 of 2 positions shown · non-contrast
Comparison: None.

CLINICAL DATA: Chest pain

CHEST - 2 VIEW

[view not recorded (1 of 2)]
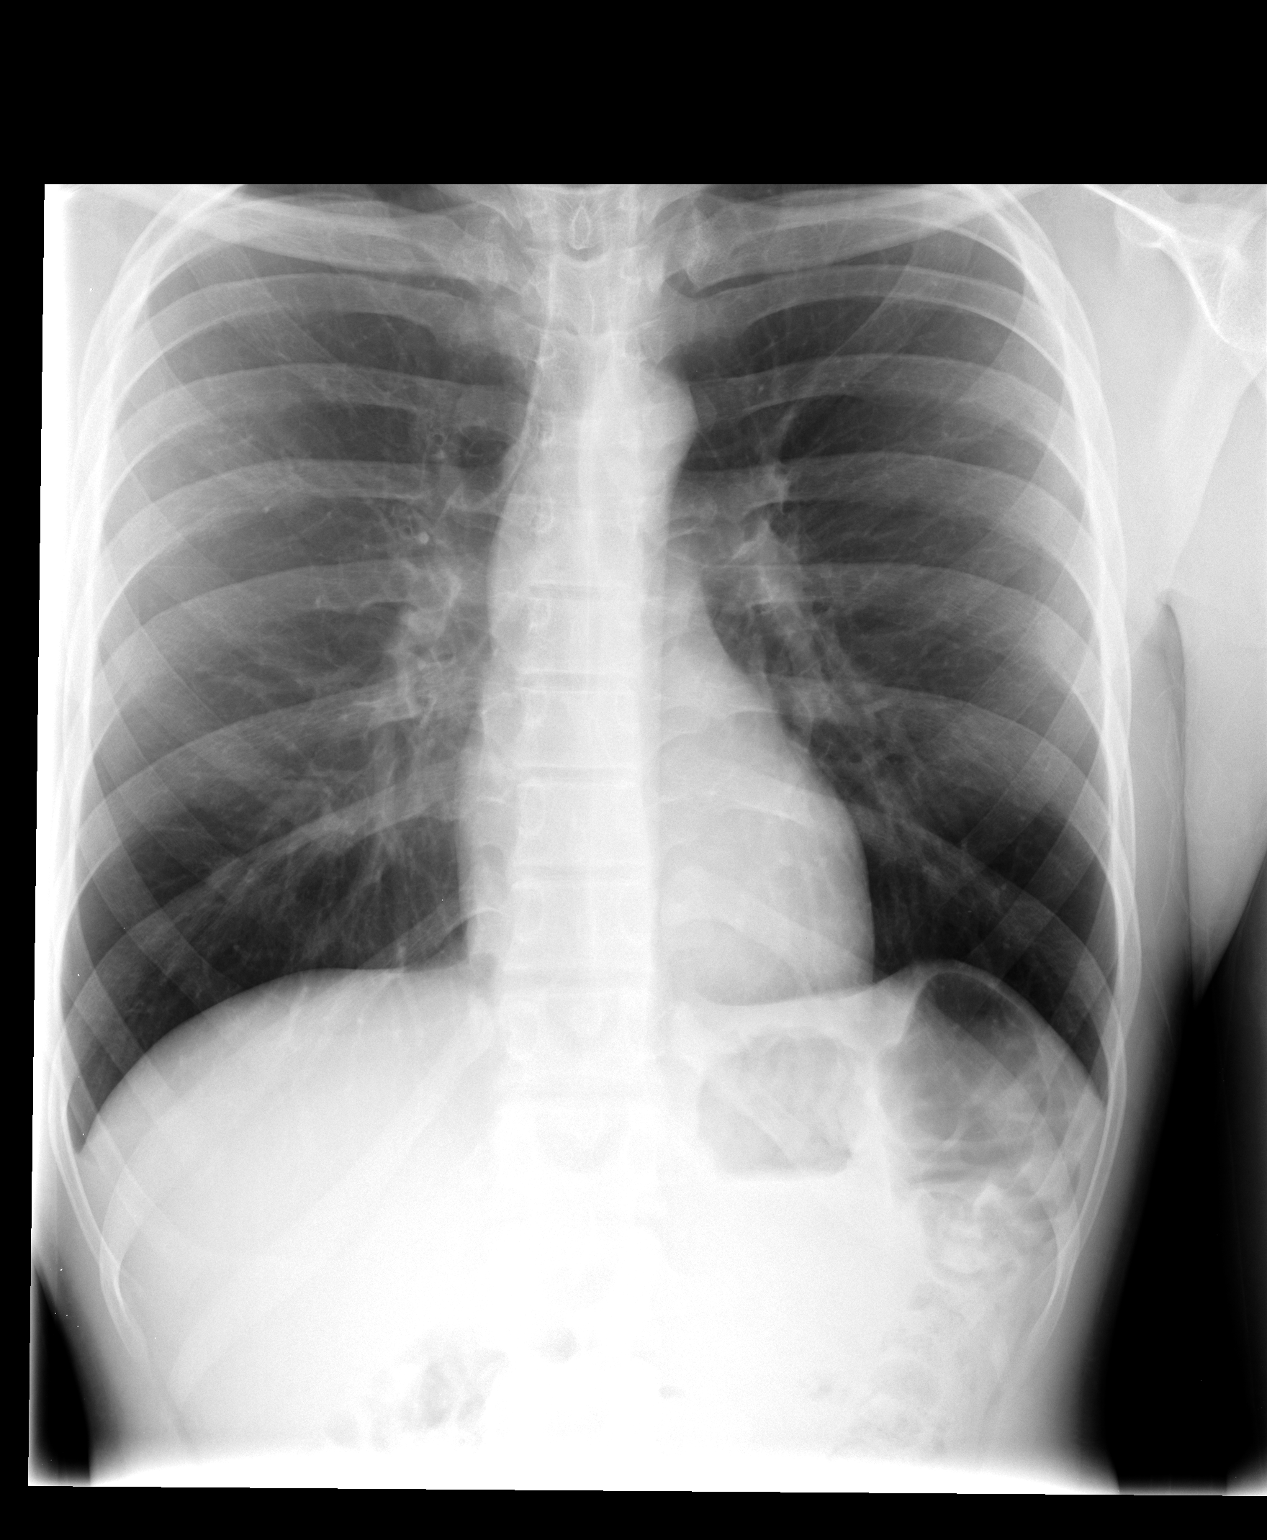

[view not recorded (2 of 2)]
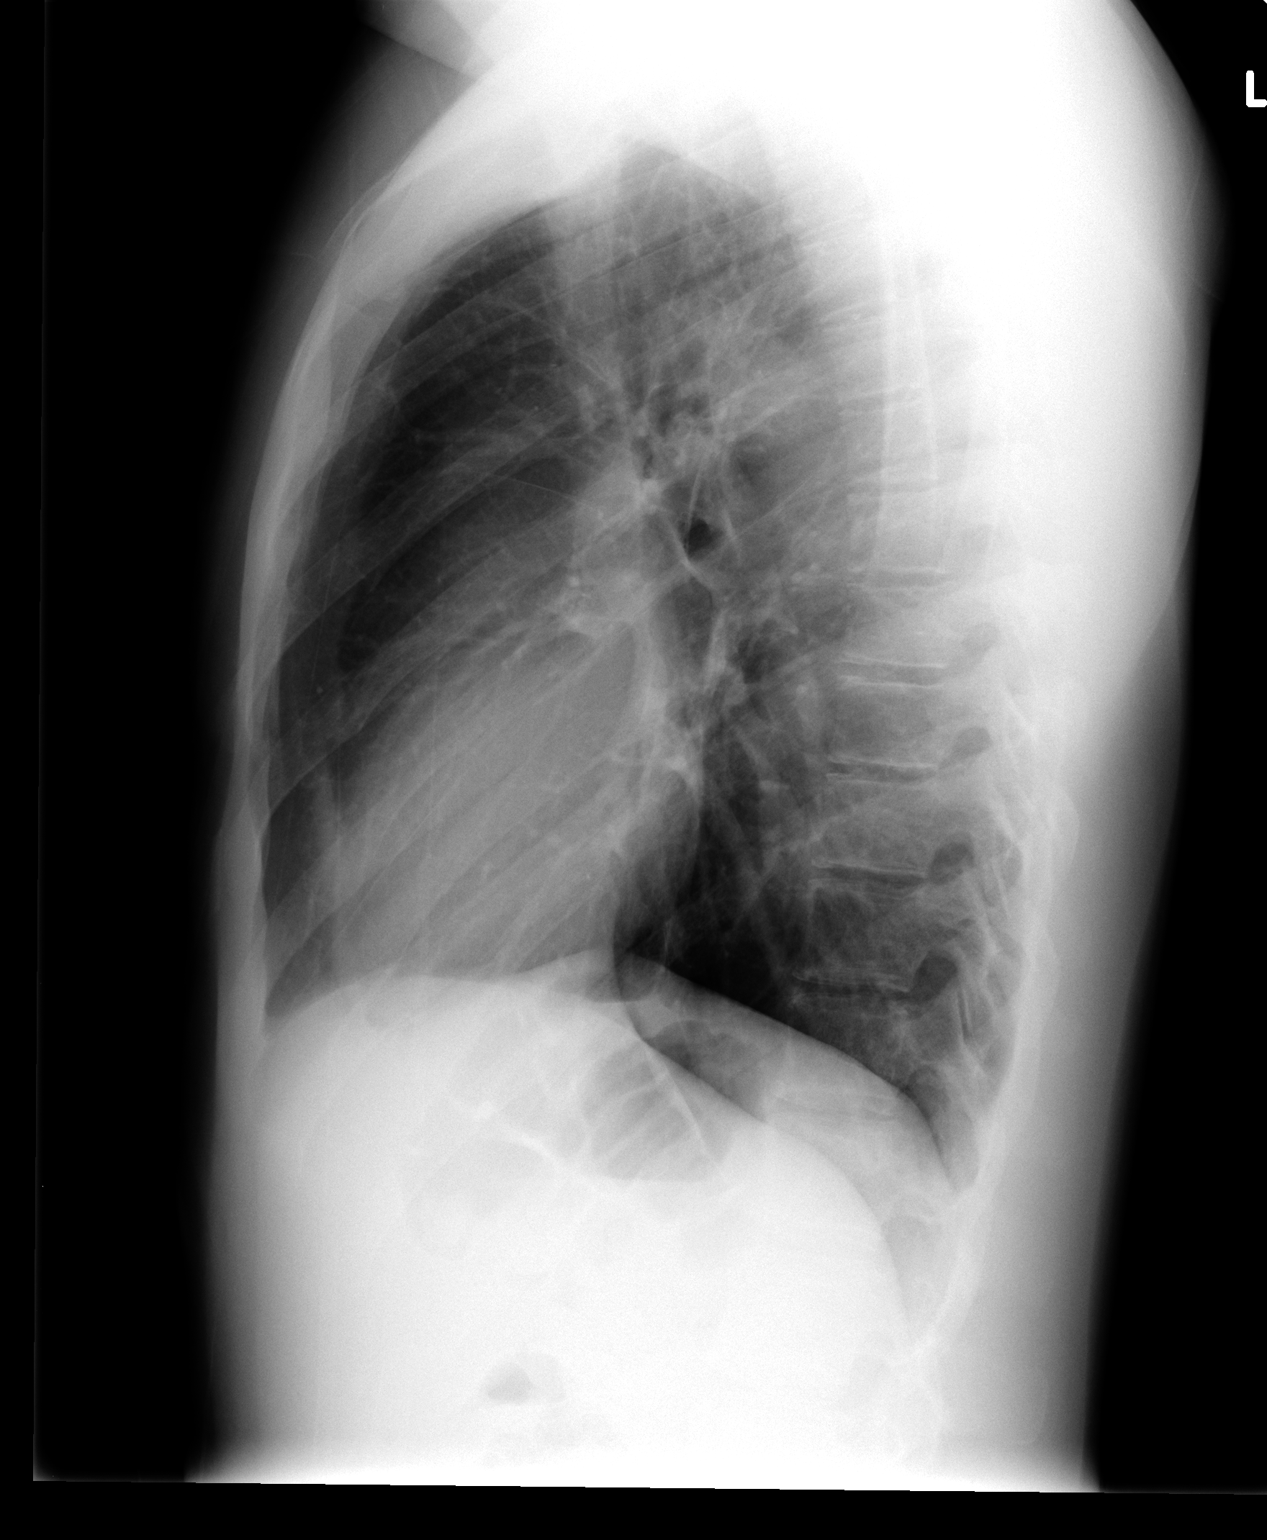

[2 of 2 positions shown; findings below may reference images not displayed]

FINDINGS: Cardiac and mediastinal contours appear normal.

The lungs appear clear.

No pleural effusion is identified.
IMPRESSION: No significant abnormality identified.

## 2019-04-27 ENCOUNTER — Other Ambulatory Visit: Payer: Self-pay

## 2019-04-27 ENCOUNTER — Ambulatory Visit: Payer: Self-pay | Attending: Internal Medicine

## 2019-04-27 DIAGNOSIS — Z20822 Contact with and (suspected) exposure to covid-19: Secondary | ICD-10-CM

## 2019-04-28 LAB — NOVEL CORONAVIRUS, NAA: SARS-CoV-2, NAA: NOT DETECTED
